# Patient Record
Sex: Female | Born: 1955 | Race: White | Hispanic: No | Marital: Married | Smoking: Current every day smoker
Health system: Southern US, Community
[De-identification: ages and names within clinical notes are randomized; demographics above are authoritative.]

## PROBLEM LIST (undated history)

## (undated) DIAGNOSIS — E7800 Pure hypercholesterolemia, unspecified: Secondary | ICD-10-CM

## (undated) HISTORY — PX: APPENDECTOMY: SHX54

## (undated) HISTORY — PX: NECK SURGERY: SHX720

## (undated) HISTORY — PX: TONSILLECTOMY: SUR1361

---

## 2010-02-12 ENCOUNTER — Ambulatory Visit: Payer: Self-pay | Admitting: Cardiology

## 2014-07-01 ENCOUNTER — Emergency Department (HOSPITAL_COMMUNITY)
Admission: EM | Admit: 2014-07-01 | Discharge: 2014-07-01 | Disposition: A | Discharge status: Home / Self Care | Payer: BC Managed Care – PPO | Attending: Emergency Medicine | Admitting: Emergency Medicine

## 2014-07-01 ENCOUNTER — Encounter (HOSPITAL_COMMUNITY): Payer: Self-pay | Admitting: Emergency Medicine

## 2014-07-01 ENCOUNTER — Emergency Department (HOSPITAL_COMMUNITY): Payer: BC Managed Care – PPO

## 2014-07-01 DIAGNOSIS — R296 Repeated falls: Secondary | ICD-10-CM | POA: Insufficient documentation

## 2014-07-01 DIAGNOSIS — Z862 Personal history of diseases of the blood and blood-forming organs and certain disorders involving the immune mechanism: Secondary | ICD-10-CM | POA: Insufficient documentation

## 2014-07-01 DIAGNOSIS — S93402A Sprain of unspecified ligament of left ankle, initial encounter: Secondary | ICD-10-CM

## 2014-07-01 DIAGNOSIS — S0990XA Unspecified injury of head, initial encounter: Secondary | ICD-10-CM | POA: Insufficient documentation

## 2014-07-01 DIAGNOSIS — Y9289 Other specified places as the place of occurrence of the external cause: Secondary | ICD-10-CM | POA: Insufficient documentation

## 2014-07-01 DIAGNOSIS — Z880 Allergy status to penicillin: Secondary | ICD-10-CM | POA: Diagnosis not present

## 2014-07-01 DIAGNOSIS — W19XXXA Unspecified fall, initial encounter: Secondary | ICD-10-CM

## 2014-07-01 DIAGNOSIS — S93409A Sprain of unspecified ligament of unspecified ankle, initial encounter: Secondary | ICD-10-CM | POA: Diagnosis not present

## 2014-07-01 DIAGNOSIS — Y9389 Activity, other specified: Secondary | ICD-10-CM | POA: Diagnosis not present

## 2014-07-01 DIAGNOSIS — Z8639 Personal history of other endocrine, nutritional and metabolic disease: Secondary | ICD-10-CM | POA: Insufficient documentation

## 2014-07-01 DIAGNOSIS — F17200 Nicotine dependence, unspecified, uncomplicated: Secondary | ICD-10-CM | POA: Diagnosis not present

## 2014-07-01 MED ORDER — OXYCODONE-ACETAMINOPHEN 5-325 MG PO TABS: 1.0000 | ORAL_TABLET | ORAL | Status: AC | PRN

## 2014-07-01 NOTE — ED Notes (Signed)
Pt ambulated independently around nurse's station with cam walker. Denies dizziness. nad noted.

## 2014-07-01 NOTE — ED Notes (Addendum)
Pt reports was washing her car today and fell back. Pt c/o of headache,back pain, left ankle pain. Mild bruise/swelling noted to left ankle. Pt unsure of events leading up to fall. Pt denies LOC. Pt reports " my head feels a little funny." pt denies any vision changes. Pt alert and oriented. Airway patent. Speech clear. Pt denies being on any blood thinner.

## 2014-07-01 NOTE — ED Provider Notes (Signed)
CSN: [id number]     Arrival date & time 07/01/14  1622 History  This chart was scribed for Sharyon Cable, MD by Peyton Bottoms, ED Scribe. This patient was seen in room APA02/APA02 and the patient's care was started at 4:44 PM.   Chief Complaint  Patient presents with  . Fall   Patient is a 58 y.o. female presenting with fall. The history is provided by the patient. No language interpreter was used.  Fall This is a new problem. The current episode started 1 to 2 hours ago. The problem has been gradually improving. Pertinent negatives include no chest pain, no abdominal pain, no headaches and no shortness of breath. Nothing aggravates the symptoms. Nothing relieves the symptoms. She has tried nothing for the symptoms.    HPI Comments: UNKNOWN FLANNIGAN is a 58 y.o. female with a history of hyperlipidemia, and no recent illnesses, who presents to the Emergency Department complaining of a fall that occurred 2 hours ago while she was washing her car. She states that she stepped on the rail of the inside of her car in order to wash the top of the car when she fell. She denies loss of consciousness. She states she had head impact during fall and swelling to her left ankle. She denies any cuts or lacerations to her head.  Patient currently denies associated headache, emesis, vision changes, chest pain, shortness of breath, neck pain, back pain, weakness or dizziness. Patient denies any prior history of heart attacks or stroke. Patient states she is not on blood thinners. Patient states she currently feels lightheaded but states it may be due to hunger. Patient is a smoker. Family reports she appeared "Wild eyed" and confused initially after incident but now back to baseline  PMH - none Past Surgical History  Procedure Laterality Date  . Appendectomy    . Tonsillectomy    . Neck surgery     History reviewed. No pertinent family history. History  Substance Use Topics  . Smoking status: Current  Every Day Smoker -- 1.00 packs/day    Types: Cigarettes  . Smokeless tobacco: Not on file  . Alcohol Use: No   OB History   Grav Para Term Preterm Abortions TAB SAB Ect Mult Living                 Review of Systems  Eyes: Negative for visual disturbance.  Respiratory: Negative for shortness of breath.   Cardiovascular: Negative for chest pain.  Gastrointestinal: Negative for abdominal pain.  Musculoskeletal: Negative for back pain and neck pain.  Neurological: Positive for light-headedness. Negative for headaches.  All other systems reviewed and are negative.   Allergies  Penicillins  Home Medications   Prior to Admission medications   Not on File   Triage Vitals: BP 139/87  Pulse 109  Temp(Src) 97.8 F (36.6 C) (Oral)  Resp 14  Ht 5' 4" (1.626 m)  Wt 185 lb (83.915 kg)  BMI 31.74 kg/m2  SpO2 99% Physical Exam  Nursing note and vitals reviewed.   CONSTITUTIONAL: Well developed/well nourished HEAD: small bruise to occiput, no bleeding, no other signs of trauma. EYES: EOMI/PERRL ENMT: Mucous membranes moist NECK: supple no meningeal signs SPINE:entire spine nontender, nexus criteria met, No bruising/crepitance/stepoffs noted to spine CV: S1/S2 noted, no murmurs/rubs/gallops noted LUNGS: Lungs are clear to auscultation bilaterally, no apparent distress ABDOMEN: soft, nontender, no rebound or guarding GU:no cva tenderness NEURO: Pt is awake/alert, moves all extremitiesx4 EXTREMITIES: pulses normal, full ROM,  tenderness to palpation to left ankle and foot, All other extremities/joints palpated/ranged and nontender SKIN: warm, color normal PSYCH: no abnormalities of mood noted  ED Course  Procedures (including critical care time)  DIAGNOSTIC STUDIES: Oxygen Saturation is 99% on RA, normal by my interpretation.    COORDINATION OF CARE: 4:53 PM- Discussed plans to order diagnostic imaging for left ankle. Pt advised of plan for treatment and pt agrees.   Pt  improved We discussed possible use of CT head.  She did not want CT head as she denies current HA, denies LOC and no vomiting.  I feel it is safe to defer CT head.  We discussed strict return precautions  For her ?syncope, EKG unremarkable, she is otherwise healthy and I Doubt cardiac dysrhythmia as cause of fall.    Imaging Review Dg Ankle Complete Left  07/01/2014   CLINICAL DATA:  pain  EXAM: LEFT ANKLE COMPLETE - 3+ VIEW  COMPARISON:  None.  FINDINGS: Partially corticated ossicles just inferior to the lateral malleolus. No significant overlying soft tissue swelling. Ankle mortise intact. Normal mineralization and alignment. No significant osseous degenerative change.  IMPRESSION: 1. Ossicles inferior to the lateral malleolus suggesting old avulsion injury. No definite acute bone abnormality.   Electronically Signed   By: Daniel  Hassell M.D.   On: 07/01/2014 17:22   Dg Foot Complete Left  07/01/2014   CLINICAL DATA:  pain  EXAM: LEFT FOOT - COMPLETE 3+ VIEW  COMPARISON:  None.  FINDINGS: There is no evidence of fracture or dislocation. There is no evidence of arthropathy or other focal bone abnormality. Soft tissues are unremarkable.  IMPRESSION: Negative.   Electronically Signed   By: Daniel  Hassell M.D.   On: 07/01/2014 17:21     EKG Interpretation  Date/Time:  Sunday July 01 2014 17:04:43 EDT Ventricular Rate:  95 PR Interval:  131 QRS Duration: 84 QT Interval:  322 QTC Calculation: 405 R Axis:   84 Text Interpretation:  Sinus rhythm Low voltage, precordial leads Non-specific ST-t changes No previous ECGs available Confirmed by WICKLINE  MD, DONALD ([id number]) on 07/01/2014 5:12:26 PM        MDM   Final diagnoses:  Fall, initial encounter  Sprain of left ankle, initial encounter  Minor head injury, initial encounter    Nursing notes including past medical history and social history reviewed and considered in documentation xrays reviewed and considered   I personally  performed the services described in this documentation, which was scribed in my presence. The recorded information has been reviewed and is accurate.      Donald W Wickline, MD 07/01/14 1827 

## 2014-07-01 NOTE — Discharge Instructions (Signed)
Ankle Sprain °An ankle sprain is an injury to the strong, fibrous tissues (ligaments) that hold the bones of your ankle joint together.  °CAUSES °An ankle sprain is usually caused by a fall or by twisting your ankle. Ankle sprains most commonly occur when you step on the outer edge of your foot, and your ankle turns inward. People who participate in sports are more prone to these types of injuries.  °SYMPTOMS  °· Pain in your ankle. The pain may be present at rest or only when you are trying to stand or walk. °· Swelling. °· Bruising. Bruising may develop immediately or within 1 to 2 days after your injury. °· Difficulty standing or walking, particularly when turning corners or changing directions. °DIAGNOSIS  °Your caregiver will ask you details about your injury and perform a physical exam of your ankle to determine if you have an ankle sprain. During the physical exam, your caregiver will press on and apply pressure to specific areas of your foot and ankle. Your caregiver will try to move your ankle in certain ways. An X-ray exam may be done to be sure a bone was not broken or a ligament did not separate from one of the bones in your ankle (avulsion fracture).  °TREATMENT  °Certain types of braces can help stabilize your ankle. Your caregiver can make a recommendation for this. Your caregiver may recommend the use of medicine for pain. If your sprain is severe, your caregiver may refer you to a surgeon who helps to restore function to parts of your skeletal system (orthopedist) or a physical therapist. °HOME CARE INSTRUCTIONS  °· Apply ice to your injury for 1-2 days or as directed by your caregiver. Applying ice helps to reduce inflammation and pain. °¨ Put ice in a plastic bag. °¨ Place a towel between your skin and the bag. °¨ Leave the ice on for 15-20 minutes at a time, every 2 hours while you are awake. °· Only take over-the-counter or prescription medicines for pain, discomfort, or fever as directed by  your caregiver. °· Elevate your injured ankle above the level of your heart as much as possible for 2-3 days. °· If your caregiver recommends crutches, use them as instructed. Gradually put weight on the affected ankle. Continue to use crutches or a cane until you can walk without feeling pain in your ankle. °· If you have a plaster splint, wear the splint as directed by your caregiver. Do not rest it on anything harder than a pillow for the first 24 hours. Do not put weight on it. Do not get it wet. You may take it off to take a shower or bath. °· You may have been given an elastic bandage to wear around your ankle to provide support. If the elastic bandage is too tight (you have numbness or tingling in your foot or your foot becomes cold and blue), adjust the bandage to make it comfortable. °· If you have an air splint, you may blow more air into it or let air out to make it more comfortable. You may take your splint off at night and before taking a shower or bath. Wiggle your toes in the splint several times per day to decrease swelling. °SEEK MEDICAL CARE IF:  °· You have rapidly increasing bruising or swelling. °· Your toes feel extremely cold or you lose feeling in your foot. °· Your pain is not relieved with medicine. °SEEK IMMEDIATE MEDICAL CARE IF: °· Your toes are numb or blue. °·   You have severe pain that is increasing. °MAKE SURE YOU:  °· Understand these instructions. °· Will watch your condition. °· Will get help right away if you are not doing well or get worse. °Document Released: 10/19/2005 Document Revised: 07/13/2012 Document Reviewed: 10/31/2011 °[employer]® Patient Information ©2015 [employer]. This information is not intended to replace advice given to you by your health care provider. Make sure you discuss any questions you have with your health care provider. ° ° °You have had a head injury which does not appear to require admission at this time. A concussion is a state of changed mental  ability from trauma. ° °SEEK IMMEDIATE MEDICAL ATTENTION IF: °There is confusion or drowsiness (although children frequently become drowsy after injury).  °You cannot awaken the injured person.  °There is nausea (feeling sick to your stomach) or continued, forceful vomiting.  °You notice dizziness or unsteadiness which is getting worse, or inability to walk.  °You have convulsions or unconsciousness.  °You experience severe, persistent headaches not relieved by Tylenol. (Do not take aspirin as this impairs clotting abilities). Take other pain medications only as directed.  °You cannot use arms or legs normally.  °There are changes in pupil sizes. (This is the black center in the colored part of the eye)  °There is clear or bloody discharge from the nose or ears.  °Change in speech, vision, swallowing, or understanding.  °Localized weakness, numbness, tingling, or change in bowel or bladder control. ° °

## 2020-09-06 ENCOUNTER — Emergency Department (HOSPITAL_COMMUNITY)
Admission: EM | Admit: 2020-09-06 | Discharge: 2020-09-06 | Disposition: A | Discharge status: Home / Self Care | Payer: BC Managed Care – PPO | Attending: Emergency Medicine | Admitting: Emergency Medicine

## 2020-09-06 ENCOUNTER — Emergency Department (HOSPITAL_COMMUNITY): Payer: BC Managed Care – PPO

## 2020-09-06 ENCOUNTER — Encounter (HOSPITAL_COMMUNITY): Payer: Self-pay | Admitting: *Deleted

## 2020-09-06 ENCOUNTER — Other Ambulatory Visit: Payer: Self-pay

## 2020-09-06 DIAGNOSIS — R197 Diarrhea, unspecified: Secondary | ICD-10-CM | POA: Diagnosis not present

## 2020-09-06 DIAGNOSIS — R1011 Right upper quadrant pain: Secondary | ICD-10-CM | POA: Diagnosis not present

## 2020-09-06 DIAGNOSIS — F17210 Nicotine dependence, cigarettes, uncomplicated: Secondary | ICD-10-CM | POA: Insufficient documentation

## 2020-09-06 HISTORY — DX: Pure hypercholesterolemia, unspecified: E78.00

## 2020-09-06 LAB — COMPREHENSIVE METABOLIC PANEL
ALT: 44 U/L (ref 0–44)
AST: 34 U/L (ref 15–41)
Albumin: 4.1 g/dL (ref 3.5–5.0)
Alkaline Phosphatase: 65 U/L (ref 38–126)
Anion gap: 10 (ref 5–15)
BUN: 12 mg/dL (ref 8–23)
CO2: 30 mmol/L (ref 22–32)
Calcium: 9.3 mg/dL (ref 8.9–10.3)
Chloride: 101 mmol/L (ref 98–111)
Creatinine, Ser: 0.94 mg/dL (ref 0.44–1.00)
GFR, Estimated: >60 mL/min (ref >60)
Glucose, Bld: 103 mg/dL — ABNORMAL HIGH (ref 70–99)
Potassium: 4.7 mmol/L (ref 3.5–5.1)
Sodium: 141 mmol/L (ref 135–145)
Total Bilirubin: 0.6 mg/dL (ref 0.3–1.2)
Total Protein: 7.4 g/dL (ref 6.5–8.1)

## 2020-09-06 LAB — URINALYSIS, ROUTINE W REFLEX MICROSCOPIC
Bacteria, UA: NONE SEEN
Bilirubin Urine: NEGATIVE
Glucose, UA: NEGATIVE mg/dL
Ketones, ur: NEGATIVE mg/dL
Leukocytes,Ua: NEGATIVE
Nitrite: NEGATIVE
Protein, ur: NEGATIVE mg/dL
Specific Gravity, Urine: 1.008 (ref 1.005–1.030)
pH: 6 (ref 5.0–8.0)

## 2020-09-06 LAB — CBC
HCT: 50.3 % — ABNORMAL HIGH (ref 36.0–46.0)
Hemoglobin: 15.9 g/dL — ABNORMAL HIGH (ref 12.0–15.0)
MCH: 28.5 pg (ref 26.0–34.0)
MCHC: 31.6 g/dL (ref 30.0–36.0)
MCV: 90.1 fL (ref 80.0–100.0)
Platelets: 417 10*3/uL — ABNORMAL HIGH (ref 150–400)
RBC: 5.58 MIL/uL — ABNORMAL HIGH (ref 3.87–5.11)
RDW: 13.2 % (ref 11.5–15.5)
WBC: 13 10*3/uL — ABNORMAL HIGH (ref 4.0–10.5)
nRBC: 0 % (ref 0.0–0.2)

## 2020-09-06 LAB — LIPASE, BLOOD: Lipase: 49 U/L (ref 11–51)

## 2020-09-06 MED ORDER — IOHEXOL 300 MG/ML  SOLN
100.0000 mL | Freq: Once | INTRAMUSCULAR | Status: AC | PRN
  Administered 2020-09-06: 100 mL via INTRAVENOUS

## 2020-09-06 NOTE — ED Notes (Signed)
Difficult to communicate w pt as her family member speaks for her and is not easily dissuaded

## 2020-09-06 NOTE — ED Provider Notes (Signed)
[facility] EMERGENCY DEPARTMENT Provider Note   CSN: [id number] Arrival date & time: 09/06/20  1217     History Chief Complaint  Patient presents with  . Diarrhea    MAAHI Watts is a 64 y.o. female here with c/o diarrhea. It began as watery stool and is now loose. She is having 4-5 episodes a day.  She is been having some intermittent right upper quadrant abdominal pain.  She saw her primary care doctor who took a chest x-ray and got some lab work and due to an elevated white blood cell count started her on Cipro and Flagyl for suspected diverticulitis.  She states it has not gotten any better.  She has no active pain at this time.  She denies fevers, chills, vomiting.  HPI     Past Medical History:  Diagnosis Date  . Hypercholesterolemia     There are no problems to display for this patient.   Past Surgical History:  Procedure Laterality Date  . APPENDECTOMY    . NECK SURGERY    . TONSILLECTOMY       OB History   No obstetric history on file.     No family history on file.  Social History   Tobacco Use  . Smoking status: Current Every Day Smoker    Packs/day: 1.00    Types: Cigarettes  . Smokeless tobacco: Never Used  Substance Use Topics  . Alcohol use: No  . Drug use: No    Home Medications Prior to Admission medications   Medication Sig Start Date End Date Taking? Authorizing Provider  ALPRAZolam Prudy Feeler) 0.5 MG tablet Take 0.5 mg by mouth 2 (two) times daily. 05/21/14   [provider]  esomeprazole (NEXIUM) 20 MG capsule Take 20 mg by mouth daily at 12 noon.    [provider]  fexofenadine (ALLEGRA) 180 MG tablet Take 180 mg by mouth daily.    [provider]  ibuprofen (ADVIL,MOTRIN) 200 MG tablet Take 400 mg by mouth every 6 (six) hours as needed for mild pain.    [provider]  oxyCODONE-acetaminophen (PERCOCET/ROXICET) 5-325 MG per tablet Take 1 tablet by mouth every 4 (four) hours as needed for severe  pain. 07/01/14   Zadie Rhine, MD  Phenyleph-Doxylamine-DM-APAP (ALKA SELTZER PLUS PO) Take 2 tablets by mouth daily as needed (Cold Symptoms).    [provider]  triamcinolone cream (KENALOG) 0.1 % Apply 1 application topically daily.    [provider]  venlafaxine XR (EFFEXOR-XR) 150 MG 24 hr capsule Take 150 mg by mouth daily. 06/07/14   [provider]  ZETIA 10 MG tablet Take 10 mg by mouth daily. 06/07/14   [provider]    Allergies    Medrol [methylprednisolone] and Penicillins  Review of Systems   Review of Systems Ten systems reviewed and are negative for acute change, except as noted in the HPI.   Physical Exam Updated Vital Signs BP (!) 150/56 (BP Location: Right Arm)   Pulse 92   Temp 97.8 F (36.6 C) (Oral)   Resp 18   SpO2 94%   Physical Exam Vitals and nursing note reviewed.  Constitutional:      General: She is not in acute distress.    Appearance: She is well-developed. She is not diaphoretic.  HENT:     Head: Normocephalic and atraumatic.  Eyes:     General: No scleral icterus.    Conjunctiva/sclera: Conjunctivae normal.  Cardiovascular:     Rate  and Rhythm: Normal rate and regular rhythm.     Heart sounds: Normal heart sounds. No murmur heard.  No friction rub. No gallop.   Pulmonary:     Effort: Pulmonary effort is normal. No respiratory distress.     Breath sounds: Normal breath sounds.  Abdominal:     General: Bowel sounds are normal. There is no distension.     Palpations: Abdomen is soft. There is no mass.     Tenderness: There is no abdominal tenderness. There is no guarding.  Musculoskeletal:     Cervical back: Normal range of motion.  Skin:    General: Skin is warm and dry.  Neurological:     Mental Status: She is alert and oriented to person, place, and time.  Psychiatric:        Behavior: Behavior normal.       ED Results / Procedures / Treatments   Labs (all labs ordered are listed, but  only abnormal results are displayed) Labs Reviewed  COMPREHENSIVE METABOLIC PANEL - Abnormal; Notable for the following components:      Result Value   Glucose, Bld 103 (*)    All other components within normal limits  CBC - Abnormal; Notable for the following components:   WBC 13.0 (*)    RBC 5.58 (*)    Hemoglobin 15.9 (*)    HCT 50.3 (*)    Platelets 417 (*)    All other components within normal limits  URINALYSIS, ROUTINE W REFLEX MICROSCOPIC - Abnormal; Notable for the following components:   Hgb urine dipstick SMALL (*)    All other components within normal limits  GASTROINTESTINAL PANEL BY PCR, STOOL (REPLACES STOOL CULTURE)  C DIFFICILE QUICK SCREEN W PCR REFLEX  LIPASE, BLOOD    EKG None  Radiology No results found.  Procedures Procedures (including critical care time)  Medications Ordered in ED Medications - No data to display  ED Course  I have reviewed the triage vital signs and the nursing notes.  Pertinent labs & imaging results that were available during my care of the patient were reviewed by me and considered in my medical decision making (see chart for details).    MDM Rules/Calculators/A&P                          [id number] VS: BP 130/77 (BP Location: Right Arm)   Pulse 90   Temp 97.8 F (36.6 C) (Oral)   Resp 20   SpO2 95%   WV:PXTGGYI is gathered by patient and emr. Previous records obtained and reviewed. DDX:The patient's complaint of diarrhea involves an extensive number of diagnostic and treatment options, and is a complaint that carries with it a high risk of complications, morbidity, and potential mortality. Given the large differential diagnosis, medical decision making is of high complexity. The differential diagnosis of diarrhea includes but is not limited to Viral- norovirus/rotavirus; Bacterial-Campylobacter,Shigella, Salmonella, Escherichia coli, E. coli 0157:H7, Yersinia enterocolitica, Vibrio cholerae, Clostridium  difficile. Parasitic- Giardia lamblia, Cryptosporidium,Entamoeba histolytica,Cyclospora, Microsporidium. Toxin- Staphylococcus aureus, Bacillus cereus. Noninfectious causes include GI Bleed, Appendicitis, Mesenteric Ischemia, Diverticulitis, Adrenal Crisis, Thyroid Storm, Toxicologic exposures, Antibiotic or drug-associated, inflammatory bowel disease. Labs: I ordered reviewed and interpreted labs which include CBC which shows mildly elevated white blood cell count, CMP with slightly elevated blood glucose of insignificant value, urine without evidence of infection. C. difficile and gastrointestinal panel by PCR ordered however patient was unable to provide a stool sample. Imaging: I ordered  and reviewed images which included CT abdomen and pelvis with contrast . I independently visualized and interpreted all imaging. There are no acute, significant findings on today's images. EKG: Consults MDM: Patient here with complaint of diarrhea x3 weeks.  She has tried Imodium without relief.  She has had some intermittent right upper quadrant pain but is asymptomatic at this time and has no abdominal tenderness on my examination.  Given the findings of her CT scan and lab work-up I do not feel that there is any emergent cause of her symptoms.  She has been treated for the last 8 days with Cipro and Flagyl also if she had any GI pathogen is likely been taking care of at this point.  I told her that she can finish this up.  She may add in Metamucil which may help bind her loose stool with increased bulk.  She may try Imodium again and I have given her an ambulatory referral to GI specialty. Patient disposition:The patient appears reasonably screened and/or stabilized for discharge and I doubt any other medical condition or other [facility] requiring further screening, evaluation, or treatment in the ED at this time prior to discharge. I have discussed lab and/or imaging findings with the patient and answered all  questions/concerns to the best of my ability.I have discussed return precautions and OP follow up.    Final Clinical Impression(s) / ED Diagnoses Final diagnoses:  None    Rx / DC Orders ED Discharge Orders    None       Arthor Captain, PA-C 09/06/20 2059    Bethann Berkshire, MD 09/06/20 2237

## 2020-09-06 NOTE — ED Triage Notes (Signed)
Diarrhea for 3 weeks.

## 2020-09-06 NOTE — Discharge Instructions (Addendum)
Get help right away if: You have chest pain. You feel extremely weak or you faint. You have bloody or black stools or stools that look like tar. You have severe pain, cramping, or bloating in your abdomen. You have trouble breathing or you are breathing very quickly. Your heart is beating very quickly. Your skin feels cold and clammy. You feel confused. You have signs of dehydration, such as: Dark urine, very little urine, or no urine. Cracked lips. Dry mouth. Sunken eyes. Sleepiness. Weakness. 

## 2020-09-06 NOTE — ED Notes (Signed)
Pt has been on Cipro and Flagyl for several weeks for her diverticular dx   No one told her that the medication could give her D nor suggested probiotics  BS audible,   NAD   abd soft

## 2020-09-06 NOTE — ED Notes (Signed)
To CT

## 2020-09-09 ENCOUNTER — Telehealth: Payer: Self-pay

## 2020-09-09 NOTE — Telephone Encounter (Signed)
Pt was referred to [place] by the ER. Please advise if we can accept her as a new patient.

## 2020-09-09 NOTE — Telephone Encounter (Signed)
Pt is aware of OV with Dr Marletta Lor this Wednesday at noon

## 2020-09-09 NOTE — Telephone Encounter (Signed)
Ok to schedule ov.  

## 2020-09-11 ENCOUNTER — Other Ambulatory Visit: Payer: Self-pay

## 2020-09-11 ENCOUNTER — Ambulatory Visit: Payer: BC Managed Care – PPO | Admitting: Internal Medicine

## 2020-09-11 ENCOUNTER — Encounter: Payer: Self-pay | Admitting: Internal Medicine

## 2020-09-11 ENCOUNTER — Encounter: Payer: Self-pay | Admitting: *Deleted

## 2020-09-11 DIAGNOSIS — R197 Diarrhea, unspecified: Secondary | ICD-10-CM

## 2020-09-11 DIAGNOSIS — R1011 Right upper quadrant pain: Secondary | ICD-10-CM | POA: Insufficient documentation

## 2020-09-11 NOTE — Progress Notes (Addendum)
Primary Care Physician:  Zachery Dauer, MD Primary Gastroenterologist:  Dr. Marletta Lor  Chief Complaint  Patient presents with  . Diarrhea    x 1 month  . Weight Loss    down 12 lbs in 1 month     HPI:   Sharon Watts is a 64 y.o. female who presents to the clinic today by referral from her PCP Dr. Dorna Leitz for evaluation.  Patient states approximately 2 to 3 weeks ago she had sudden onset of abdominal pain and diarrhea.  Notes her pain is right upper quadrant and radiates to her shoulder.  No GERD or heartburn.  No dysphagia or odynophagia.  No melena or hematochezia.  Does chronically take diclofenac orally but states she stopped this last week.  Also stopped her Zetia a few days ago and has not noted any improvement.  Diarrhea occurs frequently on a daily basis multiple times daily.  States she was diagnosed with microscopic colitis in 2014 which responded well to budesonide therapy no melena or hematochezia as above.  Last colonoscopy in 2019 in [place] which she states was unremarkable.  No recent sick contacts that she knows of.  No recent picnics or outdoor eating events.  No change in diet.   Recently presented to the ERDue to her symptoms, blood work unremarkable sides mildly elevated WBC of 13,000.  CT abdomen pelvis which I personally reviewed, showed small hiatal hernia, sigmoid diverticulosis without diverticulitis, otherwise largely unremarkable.  She was discharged home on Cipro and Flagyl without improvement in her symptoms.  Past Medical History:  Diagnosis Date  . Hypercholesterolemia     Past Surgical History:  Procedure Laterality Date  . APPENDECTOMY    . NECK SURGERY    . TONSILLECTOMY      Current Outpatient Medications  Medication Sig Dispense Refill  . ALPRAZolam (XANAX) 0.5 MG tablet Take 0.5 mg by mouth 2 (two) times daily.    . Cholecalciferol (VITAMIN D3 PO) Take 2,000 Units by mouth daily. 2000 mg    . esomeprazole (NEXIUM) 20 MG capsule Take 20 mg  by mouth daily at 12 noon.    . fexofenadine (ALLEGRA) 180 MG tablet Take 180 mg by mouth daily.    . hydrOXYzine (ATARAX/VISTARIL) 50 MG tablet Take 50 mg by mouth daily.    Marland Kitchen ibuprofen (ADVIL,MOTRIN) 200 MG tablet Take 400 mg by mouth every 6 (six) hours as needed for mild pain.    Marland Kitchen triamcinolone cream (KENALOG) 0.1 % Apply 1 application topically daily.    Marland Kitchen venlafaxine XR (EFFEXOR-XR) 150 MG 24 hr capsule Take 150 mg by mouth daily.    Marland Kitchen ZETIA 10 MG tablet Take 10 mg by mouth daily.    Marland Kitchen oxyCODONE-acetaminophen (PERCOCET/ROXICET) 5-325 MG per tablet Take 1 tablet by mouth every 4 (four) hours as needed for severe pain. (Patient not taking: Reported on 09/11/2020) 15 tablet 0  . Phenyleph-Doxylamine-DM-APAP (ALKA SELTZER PLUS PO) Take 2 tablets by mouth daily as needed (Cold Symptoms). (Patient not taking: Reported on 09/11/2020)     No current facility-administered medications for this visit.    Allergies as of 09/11/2020 - Review Complete 09/11/2020  Allergen Reaction Noted  . Medrol [methylprednisolone] Hives 07/01/2014  . Penicillins Hives 07/01/2014    History reviewed. No pertinent family history.  Social History   Socioeconomic History  . Marital status: Married    Spouse name: Not on file  . Number of children: Not on file  . Years of education:  Not on file  . Highest education level: Not on file  Occupational History  . Not on file  Tobacco Use  . Smoking status: Current Every Day Smoker    Packs/day: 1.00    Types: Cigarettes  . Smokeless tobacco: Never Used  Substance and Sexual Activity  . Alcohol use: No  . Drug use: No  . Sexual activity: Not on file  Other Topics Concern  . Not on file  Social History Narrative  . Not on file   Social Determinants of Health   Financial Resource Strain:   . Difficulty of Paying Living Expenses: Not on file  Food Insecurity:   . Worried About Programme researcher, broadcasting/film/video in the Last Year: Not on file  . Ran Out of Food in  the Last Year: Not on file  Transportation Needs:   . Lack of Transportation (Medical): Not on file  . Lack of Transportation (Non-Medical): Not on file  Physical Activity:   . Days of Exercise per Week: Not on file  . Minutes of Exercise per Session: Not on file  Stress:   . Feeling of Stress : Not on file  Social Connections:   . Frequency of Communication with Friends and Family: Not on file  . Frequency of Social Gatherings with Friends and Family: Not on file  . Attends Religious Services: Not on file  . Active Member of Clubs or Organizations: Not on file  . Attends Banker Meetings: Not on file  . Marital Status: Not on file  Intimate Partner Violence:   . Fear of Current or Ex-Partner: Not on file  . Emotionally Abused: Not on file  . Physically Abused: Not on file  . Sexually Abused: Not on file    Subjective: Review of Systems  Constitutional: Negative for chills and fever.  HENT: Negative for congestion and hearing loss.   Eyes: Negative for blurred vision and double vision.  Respiratory: Negative for cough and shortness of breath.   Cardiovascular: Negative for chest pain and palpitations.  Gastrointestinal: Positive for abdominal pain and diarrhea. Negative for blood in stool, constipation, heartburn, melena and vomiting.  Genitourinary: Negative for dysuria and urgency.  Musculoskeletal: Negative for joint pain and myalgias.  Skin: Negative for itching and rash.  Neurological: Negative for dizziness and headaches.  Psychiatric/Behavioral: Negative for depression. The patient is not nervous/anxious.        Objective: BP (!) 154/92   Pulse (!) 105   Temp (!) 96.9 F (36.1 C) (Temporal)   Ht 5\' 4"  (1.626 m)   Wt 203 lb 6.4 oz (92.3 kg)   BMI 34.91 kg/m  Physical Exam Constitutional:      Appearance: Normal appearance.  HENT:     Head: Normocephalic and atraumatic.  Eyes:     Extraocular Movements: Extraocular movements intact.      Conjunctiva/sclera: Conjunctivae normal.  Cardiovascular:     Rate and Rhythm: Normal rate and regular rhythm.  Pulmonary:     Effort: Pulmonary effort is normal.     Breath sounds: Normal breath sounds.  Abdominal:     General: Bowel sounds are normal.     Palpations: Abdomen is soft.  Musculoskeletal:        General: No swelling. Normal range of motion.     Cervical back: Normal range of motion and neck supple.  Skin:    General: Skin is warm and dry.     Coloration: Skin is not jaundiced.  Neurological:  General: No focal deficit present.     Mental Status: She is alert and oriented to person, place, and time.  Psychiatric:        Mood and Affect: Mood normal.        Behavior: Behavior normal.      Assessment: *Diarrhea-new onset *Right upper quadrant pain *History of microscopic colitis  Plan: Etiology of patient's diarrhea unclear.  Given her reported history of microscopic colitis, this is certainly high on differential.   -We will check stool studies to rule out infectious diarrhea. -Check TTG IgA as well as TSH -For her right upper quadrant pain I will check ultrasound to rule out biliary colic -If her labs and stool studies are unremarkable, I will likely start her on budesonide therapy for recurrent microscopic colitis. -May need EGD in the future to rule out H. pylori as well as celiac sprue -May need hydrogen breath testing to rule out SIBO -Further recommendations to follow  Patient to follow-up in 3 months or sooner if needed  09/11/2020 12:18 PM   Disclaimer: This note was dictated with voice recognition software. Similar sounding words can inadvertently be transcribed and may not be corrected upon review.

## 2020-09-11 NOTE — Patient Instructions (Signed)
We need to check stool studies to make sure you do not have an infection as cause of your diarrhea.  I will also request your previous colonoscopy report from [facility].  We will order right upper quadrant ultrasound to evaluate gallbladder dysfunction as a cause of your symptoms.  Assuming your stool studies are negative, I will likely start you on budesonide given history of microscopic colitis and normal colonoscopy in 2019.  Follow-up with GI in 3 months or sooner if needed.  At [facility] we value your feedback. You may receive a survey about your visit today. Please share your experience as we strive to create trusting relationships with our patients to provide genuine, compassionate, quality care.  We appreciate your understanding and patience as we review any laboratory studies, imaging, and other diagnostic tests that are ordered as we care for you. Our office policy is 5 business days for review of these results, and any emergent or urgent results are addressed in a timely manner for your best interest. If you do not hear from our office in 1 week, please contact us.   We also encourage the use of MyChart, which contains your medical information for your review as well. If you are not enrolled in this feature, an access code is on this after visit summary for your convenience. Thank you for allowing [place] to be involved in your care.  It was great to see you today!  I hope you have a great rest of your fall!!    Sharon Watts, D.O. Gastroenterology and Hepatology [facility]

## 2020-09-14 LAB — CELIAC AB TTG DGP TIGA
Antigliadin Abs, IgA: 5 units (ref 0–19)
Gliadin IgG: 2 units (ref 0–19)
IgA/Immunoglobulin A, Serum: 228 mg/dL (ref 87–352)
Tissue Transglut Ab: 7 U/mL — ABNORMAL HIGH (ref 0–5)
Transglutaminase IgA: <2 U/mL (ref 0–3)

## 2020-09-14 LAB — T4 AND TSH
T4, Total: 8.3 ug/dL (ref 4.5–12.0)
TSH: 1.51 u[IU]/mL (ref 0.450–4.500)

## 2020-09-17 ENCOUNTER — Ambulatory Visit: Payer: BC Managed Care – PPO | Admitting: Gastroenterology

## 2020-09-17 ENCOUNTER — Telehealth: Payer: Self-pay | Admitting: Internal Medicine

## 2020-09-17 ENCOUNTER — Telehealth: Payer: Self-pay

## 2020-09-17 DIAGNOSIS — A0472 Enterocolitis due to Clostridium difficile, not specified as recurrent: Secondary | ICD-10-CM

## 2020-09-17 LAB — GI PROFILE, STOOL, PCR

## 2020-09-17 MED ORDER — VANCOMYCIN HCL 125 MG PO CAPS: 125.0000 mg | ORAL_CAPSULE | Freq: Four times a day (QID) | ORAL | 0 refills | Status: AC

## 2020-09-17 NOTE — Telephone Encounter (Signed)
Patient called and said the medicine that was called in for her c-diff needs a prior authorization.

## 2020-09-17 NOTE — Telephone Encounter (Signed)
Routing message 

## 2020-09-17 NOTE — Telephone Encounter (Signed)
NOTED

## 2020-09-17 NOTE — Telephone Encounter (Signed)
Patient has C. difficile infection.  I called and discussed these results with her.  I will send in prescription for vancomycin oral 125 mg 4 times daily for 10 days.    I counseled patient on the importance of cleaning practices at home including using bleach in the bathroom and surfaces as well as washing her hands frequently with warm soapy water.  She has an ultrasound scheduled for Thursday of this week, can we call and cancel this for her?  She will follow up with me as previously scheduled.  Thank you

## 2020-09-17 NOTE — Telephone Encounter (Signed)
PT APPROVED TO RECEIVE Rx VANCOMYCIN HCL 125 MG CAPSULE FOR 09/17/2020 TO 12/16/2020. WILL FORWARD TO [facility] TO SCAN INTO CHART.

## 2020-09-19 ENCOUNTER — Ambulatory Visit (HOSPITAL_COMMUNITY): Payer: BC Managed Care – PPO

## 2020-09-19 NOTE — Telephone Encounter (Signed)
PTS. MED IS APPROVED. FAXED TO [facility] IN [place] SO PT CAN RECEIVE HER MEDS. PHONED AND LM ON PTS VM THAT HER MED WAS APPROVED AND LETTER FAXED TO HER PHARMACY.

## 2020-09-24 ENCOUNTER — Telehealth: Payer: Self-pay | Admitting: Internal Medicine

## 2020-09-24 NOTE — Telephone Encounter (Signed)
Phoned and spoke with the pt. She states she has had diarrhea since Oct. 11th but she is not having any abdominal pain. She is very fatigued from this please advise.

## 2020-09-24 NOTE — Telephone Encounter (Signed)
[phone] Patient diagnosed with c-diff and she has antibiotics until Friday and her diarrhea has not stopped.  She is concerned that it is not going away  Please call

## 2020-09-25 ENCOUNTER — Telehealth: Payer: Self-pay

## 2020-09-25 NOTE — Telephone Encounter (Signed)
Pt called regarding her diarrhea this morning. States she never heard back from [place] regarding her phone call yesterday, I advised her a phone note was sent and I was still waiting on a response. I asked her was her diarrhea better she stated that she has only been once today compared to 4 yesterday. I told her it sounds like its getting better and she stated it is but she is worried that she may need another round of antibiotics. Pt also stated she thought to try Imodium AD but according to what she read she has no business taking it. Dr. Marletta Lor please advise

## 2020-09-29 ENCOUNTER — Telehealth: Payer: Self-pay | Admitting: Internal Medicine

## 2020-09-29 DIAGNOSIS — A0472 Enterocolitis due to Clostridium difficile, not specified as recurrent: Secondary | ICD-10-CM

## 2020-09-29 MED ORDER — VANCOMYCIN HCL 125 MG PO CAPS: 125.0000 mg | ORAL_CAPSULE | Freq: Four times a day (QID) | ORAL | 0 refills | Status: AC

## 2020-09-29 NOTE — Telephone Encounter (Signed)
I called and spoke with patient. She states her BMs are improved to semi solid. I am going to send in her another 10 days worth of PO Vancomycin. I explained to her I was out of town last week for the holiday and she understands.

## 2020-09-30 NOTE — Telephone Encounter (Signed)
noted 

## 2020-10-03 ENCOUNTER — Telehealth: Payer: Self-pay | Admitting: Internal Medicine

## 2020-10-03 NOTE — Telephone Encounter (Signed)
I called patient and spoke with her

## 2020-10-03 NOTE — Telephone Encounter (Signed)
Patient wants to speak with Dr Marletta Lor. Please call patient. [phone]

## 2020-10-03 NOTE — Telephone Encounter (Signed)
Dr. Marletta Lor I phoned the pt back and she advises she spoke with you Sunday and you sent in Rx for Vancomycin and she states she's still having diarrhea. States she wants to speak with you because she is not sure if you want her to continue the other medications for C-Diff.

## 2020-10-04 ENCOUNTER — Telehealth: Payer: Self-pay

## 2020-10-04 NOTE — Telephone Encounter (Signed)
noted 

## 2020-10-04 NOTE — Telephone Encounter (Signed)
PT HAS BEEN APPROVED FOR VANCOMYCIN HCL 125 MG CAPSULES FROM 09/17/2020 TO 12/16/2020.

## 2020-10-09 ENCOUNTER — Telehealth: Payer: Self-pay | Admitting: Internal Medicine

## 2020-10-09 NOTE — Telephone Encounter (Signed)
Please call patient [phone], she has c-diff and is on her last antibiotic and she is still having diarrhea

## 2020-10-09 NOTE — Telephone Encounter (Signed)
Routing to Dr. Marletta Lor I had discussed with him. He is going to call the pt.

## 2020-10-10 ENCOUNTER — Telehealth: Payer: Self-pay

## 2020-10-10 NOTE — Telephone Encounter (Signed)
noted 

## 2020-10-10 NOTE — Telephone Encounter (Signed)
Sent Dr. Marletta Lor a text with this pt's appt with him on 12/06/19 and her ultrasound is scheduled for 10/15/20 @ 11:30 am

## 2020-10-10 NOTE — Telephone Encounter (Signed)
1.Dr. Marletta Lor just called and stated to please reschedule the pt's U/S.   2. Dr. Marletta Lor states please schedule this pt to return to the office in 2 to 3 weeks with him or any of the other providers.

## 2020-10-10 NOTE — Telephone Encounter (Signed)
[place] scheduled for 10/15/20 at 11:30am, arrive at 11:15am. NPO 6-8 hours before test.   Called and informed pt of [place] appt. Letter mailed.

## 2020-10-10 NOTE — Telephone Encounter (Signed)
Patient scheduled to see Dr. Marletta Lor 02/03  that is the next available

## 2020-10-15 ENCOUNTER — Other Ambulatory Visit: Payer: Self-pay

## 2020-10-15 ENCOUNTER — Ambulatory Visit (HOSPITAL_COMMUNITY)
Admission: RE | Admit: 2020-10-15 | Discharge: 2020-10-15 | Disposition: A | Discharge status: Home / Self Care | Payer: BC Managed Care – PPO | Source: Ambulatory Visit | Attending: Internal Medicine | Admitting: Internal Medicine

## 2020-10-15 DIAGNOSIS — R197 Diarrhea, unspecified: Secondary | ICD-10-CM | POA: Insufficient documentation

## 2020-10-15 DIAGNOSIS — R1011 Right upper quadrant pain: Secondary | ICD-10-CM

## 2020-10-16 ENCOUNTER — Telehealth: Payer: Self-pay | Admitting: Internal Medicine

## 2020-10-16 NOTE — Telephone Encounter (Signed)
Pt said she just spoke to the nurse and wanted her to call her back. [phone]

## 2020-10-16 NOTE — Telephone Encounter (Signed)
Spoke with pt. Pt reports that she is still having diarrhea s/p Vancomycin for Cdiff. Pt is having 4-5 bowel movements a day. Pt mentioned that she is taking an Imodium tablet when she has to leave her home. Pt reports no pain, no nausea or vomiting. Stool is loose to watery.

## 2020-12-02 NOTE — Telephone Encounter (Signed)
I received a call from the patient stating she is having diarrhea.   She is having about 4-5 times bowel movements a day.  She has an appointment scheduled on 2/9 with Marletta Lor, however she was wondering if he could give her something for her diarrhea.   She has no fever, abd pain, nausea or vomiting, but she is experiencing some lower back pain.  Routing to Dr. Marletta Lor  The patient can be reached at [phone]

## 2020-12-03 ENCOUNTER — Telehealth: Payer: Self-pay | Admitting: Internal Medicine

## 2020-12-03 DIAGNOSIS — R197 Diarrhea, unspecified: Secondary | ICD-10-CM

## 2020-12-03 NOTE — Telephone Encounter (Signed)
Ordered placed for [facility]. Thank you

## 2020-12-03 NOTE — Telephone Encounter (Signed)
Lab order faxed to [facility] in [place], [place] at [phone]

## 2020-12-03 NOTE — Telephone Encounter (Signed)
Pt called to see if Dr. Marletta Lor had some information about her diarrhea. Routing message to Dr. Marletta Lor. Pt is aware that we will contact her back.

## 2020-12-03 NOTE — Telephone Encounter (Signed)
It has been 4 months since initial c diff infection. I am going to recheck her now for recurrence with stool testing. Please let me know which lab she would prefer and I will place order. Thank you

## 2020-12-03 NOTE — Telephone Encounter (Signed)
Spoke with pt. Pt is aware that Dr. Marletta Lor would like to check for C diff and lab orders were placed for [facility]. Pt will complete stool testing.

## 2020-12-03 NOTE — Telephone Encounter (Signed)
Patient would like to go to [facility] in [place], [place]

## 2020-12-09 LAB — C DIFFICILE, CYTOTOXIN B

## 2020-12-09 LAB — C DIFFICILE TOXINS A+B W/RFLX: C difficile Toxins A+B, EIA: NEGATIVE

## 2020-12-09 NOTE — Telephone Encounter (Signed)
Patient scheduled for appointment this Wednesday

## 2020-12-11 ENCOUNTER — Encounter: Payer: Self-pay | Admitting: *Deleted

## 2020-12-11 ENCOUNTER — Encounter: Payer: Self-pay | Admitting: Internal Medicine

## 2020-12-11 ENCOUNTER — Other Ambulatory Visit: Payer: Self-pay

## 2020-12-11 ENCOUNTER — Ambulatory Visit: Payer: BC Managed Care – PPO | Admitting: Internal Medicine

## 2020-12-11 VITALS — BP 139/80 | HR 104 | Temp 96.2°F | Ht 63.0 in | Wt 194.2 lb

## 2020-12-11 DIAGNOSIS — A0472 Enterocolitis due to Clostridium difficile, not specified as recurrent: Secondary | ICD-10-CM

## 2020-12-11 DIAGNOSIS — R197 Diarrhea, unspecified: Secondary | ICD-10-CM

## 2020-12-11 DIAGNOSIS — R1011 Right upper quadrant pain: Secondary | ICD-10-CM

## 2020-12-11 MED ORDER — BUDESONIDE 3 MG PO CPEP: 9.0000 mg | ORAL_CAPSULE | Freq: Every day | ORAL | 2 refills | Status: AC

## 2020-12-11 MED ORDER — DIPHENOXYLATE-ATROPINE 2.5-0.025 MG PO TABS: 1.0000 | ORAL_TABLET | Freq: Four times a day (QID) | ORAL | 2 refills | Status: AC | PRN

## 2020-12-11 NOTE — Progress Notes (Signed)
Referring Provider: Zachery Dauer, MD Primary Care Physician:  Zachery Dauer, MD Primary GI:  Dr. Marletta Lor  Chief Complaint  Patient presents with  . Follow-up    Diarrhea daily    HPI:   Sharon Watts is a 65 y.o. female who presents to clinic today for follow-up.  Previously seen approximately 4 months ago for diarrhea.  Subsequent stool testing revealed that she was positive for C. difficile.  She underwent a 10-day course of vancomycin which initially helped but her symptoms did not fully disappear.  She was then placed on extended 6-week taper which she states cleared up her diarrhea for nearly 2 months and now it is back.  She states she is having 10 loose stools daily.  I repeated C. difficile testing and is negative.  She notes history of microscopic colitis in 2012 and was treated with budesonide at that time.  She feels as though that is likely is what causing it again today.  Also notes chronic right upper quadrant pain.  She had an ultrasound which was negative for gallbladder pathology but did note fatty liver disease.  He states she has been taking a probiotic for nearly 2 months now.  Past Medical History:  Diagnosis Date  . Hypercholesterolemia     Past Surgical History:  Procedure Laterality Date  . APPENDECTOMY    . NECK SURGERY    . TONSILLECTOMY      Current Outpatient Medications  Medication Sig Dispense Refill  . ALPRAZolam (XANAX) 0.5 MG tablet Take 0.5 mg by mouth daily.    . budesonide (ENTOCORT EC) 3 MG 24 hr capsule Take 3 capsules (9 mg total) by mouth daily. 90 capsule 2  . Cholecalciferol (VITAMIN D3 PO) Take 2,000 Units by mouth daily. 2000 mg    . diphenoxylate-atropine (LOMOTIL) 2.5-0.025 MG tablet Take 1 tablet by mouth 4 (four) times daily as needed for diarrhea or loose stools. 30 tablet 2  . fexofenadine (ALLEGRA) 180 MG tablet Take 180 mg by mouth daily.    . hydrOXYzine (ATARAX/VISTARIL) 50 MG tablet Take 50 mg by mouth daily.    Marland Kitchen  ibuprofen (ADVIL,MOTRIN) 200 MG tablet Take 400 mg by mouth as needed for mild pain.    Marland Kitchen venlafaxine XR (EFFEXOR-XR) 150 MG 24 hr capsule Take 75 mg by mouth daily. Takes 75 mg daily.    Marland Kitchen ZETIA 10 MG tablet Take 10 mg by mouth daily.    Marland Kitchen esomeprazole (NEXIUM) 20 MG capsule Take 20 mg by mouth daily at 12 noon. (Patient not taking: Reported on 12/11/2020)    . oxyCODONE-acetaminophen (PERCOCET/ROXICET) 5-325 MG per tablet Take 1 tablet by mouth every 4 (four) hours as needed for severe pain. (Patient not taking: No sig reported) 15 tablet 0  . Phenyleph-Doxylamine-DM-APAP (ALKA SELTZER PLUS PO) Take 2 tablets by mouth daily as needed (Cold Symptoms). (Patient not taking: No sig reported)    . triamcinolone cream (KENALOG) 0.1 % Apply 1 application topically daily. (Patient not taking: Reported on 12/11/2020)     No current facility-administered medications for this visit.    Allergies as of 12/11/2020 - Review Complete 12/11/2020  Allergen Reaction Noted  . Medrol [methylprednisolone] Hives 07/01/2014  . Penicillins Hives 07/01/2014    No family history on file.  Social History   Socioeconomic History  . Marital status: Married    Spouse name: Not on file  . Number of children: Not on file  . Years of education: Not  on file  . Highest education level: Not on file  Occupational History  . Not on file  Tobacco Use  . Smoking status: Current Every Day Smoker    Packs/day: 1.00    Types: Cigarettes  . Smokeless tobacco: Never Used  Substance and Sexual Activity  . Alcohol use: No  . Drug use: No  . Sexual activity: Not on file  Other Topics Concern  . Not on file  Social History Narrative  . Not on file   Social Determinants of Health   Financial Resource Strain: Not on file  Food Insecurity: Not on file  Transportation Needs: Not on file  Physical Activity: Not on file  Stress: Not on file  Social Connections: Not on file    Subjective: Review of Systems   Constitutional: Negative for chills and fever.  HENT: Negative for congestion and hearing loss.   Eyes: Negative for blurred vision and double vision.  Respiratory: Negative for cough and shortness of breath.   Cardiovascular: Negative for chest pain and palpitations.  Gastrointestinal: Positive for abdominal pain and diarrhea. Negative for blood in stool, constipation, heartburn, melena and vomiting.  Genitourinary: Negative for dysuria and urgency.  Musculoskeletal: Negative for joint pain and myalgias.  Skin: Negative for itching and rash.  Neurological: Negative for dizziness and headaches.  Psychiatric/Behavioral: Negative for depression. The patient is not nervous/anxious.      Objective: BP 139/80   Pulse (!) 104   Temp (!) 96.2 F (35.7 C) (Temporal)   Ht 5\' 3"  (1.6 m)   Wt 194 lb 3.2 oz (88.1 kg)   BMI 34.40 kg/m  Physical Exam Constitutional:      Appearance: Normal appearance.  HENT:     Head: Normocephalic and atraumatic.  Eyes:     Extraocular Movements: Extraocular movements intact.     Conjunctiva/sclera: Conjunctivae normal.  Cardiovascular:     Rate and Rhythm: Normal rate and regular rhythm.  Pulmonary:     Effort: Pulmonary effort is normal.     Breath sounds: Normal breath sounds.  Abdominal:     General: Bowel sounds are normal.     Palpations: Abdomen is soft.  Musculoskeletal:        General: No swelling. Normal range of motion.     Cervical back: Normal range of motion and neck supple.  Skin:    General: Skin is warm and dry.     Coloration: Skin is not jaundiced.  Neurological:     General: No focal deficit present.     Mental Status: She is alert and oriented to person, place, and time.  Psychiatric:        Mood and Affect: Mood normal.        Behavior: Behavior normal.      Assessment: *Diarrhea *Right upper quadrant pain *History of C. difficile infection  Plan: Etiology of patient's diarrhea unclear.  Her recent C. difficile  testing is negative so I believe she has recovered from this infection.  Given her history of microscopic colitis I am concerned that this is resurface.  We will empirically treat her on budesonide for 6 to 8 weeks and see how she does.  If not improved we will proceed with colonoscopy to further evaluate.  Continue on probiotics.  Regards to her right upper quadrant pain, she is concerned about her gallbladder despite relatively unremarkable ultrasound.  We will order HIDA scan to further evaluate this.  Patient follow-up in 6 to 8 weeks or sooner if  needed.  12/11/2020 3:44 PM   Disclaimer: This note was dictated with voice recognition software. Similar sounding words can inadvertently be transcribed and may not be corrected upon review.

## 2020-12-11 NOTE — Patient Instructions (Signed)
We will check HIDA scan to further evaluate your gallbladder.  Due to your history of microscopic colitis, I am going to empirically treat you on budesonide 9 mg daily.  I want you to take this dose for the next 8 weeks.  Follow-up with me in 2 to 3 months.  If this does not help your symptoms, we may need to consider colonoscopy.  Continue on probiotic.  I have also printed information office regarding a low FODMAP diet which may continue and on which food groups to avoid.  At [facility] we value your feedback. You may receive a survey about your visit today. Please share your experience as we strive to create trusting relationships with our patients to provide genuine, compassionate, quality care.  We appreciate your understanding and patience as we review any laboratory studies, imaging, and other diagnostic tests that are ordered as we care for you. Our office policy is 5 business days for review of these results, and any emergent or urgent results are addressed in a timely manner for your best interest. If you do not hear from our office in 1 week, please contact us.   We also encourage the use of MyChart, which contains your medical information for your review as well. If you are not enrolled in this feature, an access code is on this after visit summary for your convenience. Thank you for allowing [place] to be involved in your care.  It was great to see you today!  I hope you have a great rest of your winter!!    Hennie Duos. Marletta Lor, D.O. Gastroenterology and Hepatology [facility]

## 2020-12-17 ENCOUNTER — Encounter (HOSPITAL_COMMUNITY): Payer: Self-pay

## 2020-12-17 ENCOUNTER — Other Ambulatory Visit: Payer: Self-pay

## 2020-12-17 ENCOUNTER — Ambulatory Visit (HOSPITAL_COMMUNITY)
Admission: RE | Admit: 2020-12-17 | Discharge: 2020-12-17 | Disposition: A | Discharge status: Home / Self Care | Payer: BC Managed Care – PPO | Source: Ambulatory Visit | Attending: Internal Medicine | Admitting: Internal Medicine

## 2020-12-17 DIAGNOSIS — A0472 Enterocolitis due to Clostridium difficile, not specified as recurrent: Secondary | ICD-10-CM | POA: Insufficient documentation

## 2020-12-17 MED ORDER — TECHNETIUM TC 99M MEBROFENIN IV KIT
5.0000 | PACK | Freq: Once | INTRAVENOUS | Status: AC | PRN
  Administered 2020-12-17: 5.5 via INTRAVENOUS

## 2021-01-21 ENCOUNTER — Encounter: Payer: Self-pay | Admitting: Internal Medicine

## 2021-07-08 IMAGING — CT CT ABD-PELV W/ CM
2 of 5 series · 17 of 46 positions shown, 19 images · IV contrast (Omnipaque or Isovue)
Comparison: None.

CLINICAL DATA: Diarrhea, right flank pain

EXAM:
CT ABDOMEN AND PELVIS WITH CONTRAST
TECHNIQUE: Multidetector CT imaging of the abdomen and pelvis was performed
using the standard protocol following bolus administration of
intravenous contrast.
CONTRAST:  100mL OMNIPAQUE IOHEXOL 300 MG/ML  SOLN

[Series 2: axial st · axial · 0.92mm/px · z∈[+804,+1179]mm · 14 of 87 slices shown, 16 images]
[im 6/87  soft-tissue]
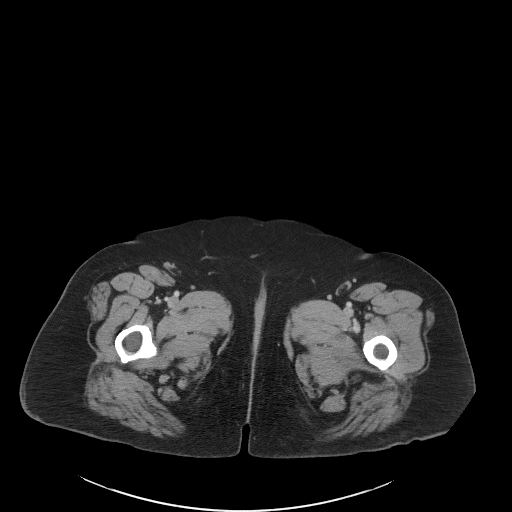
[im 6/87  bone]
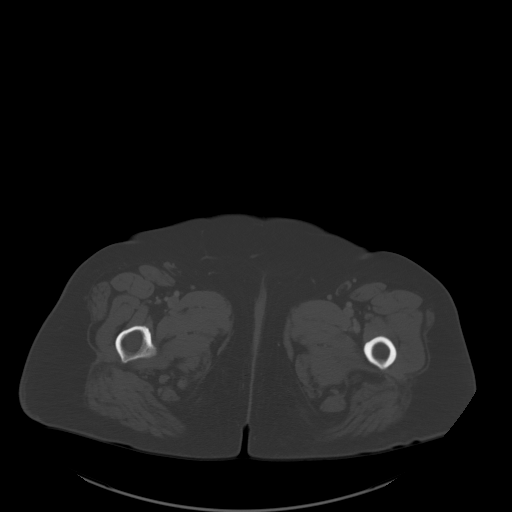
[im 11/87  soft-tissue]
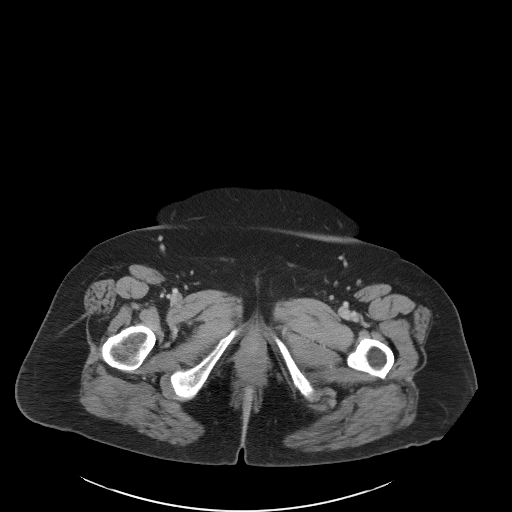
[im 16/87  soft-tissue]
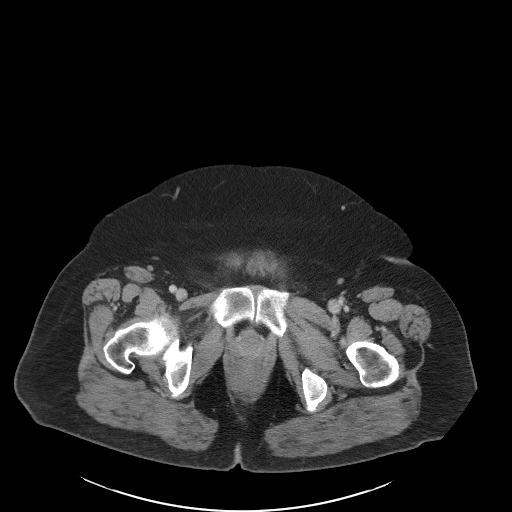
[im 26/87  soft-tissue]
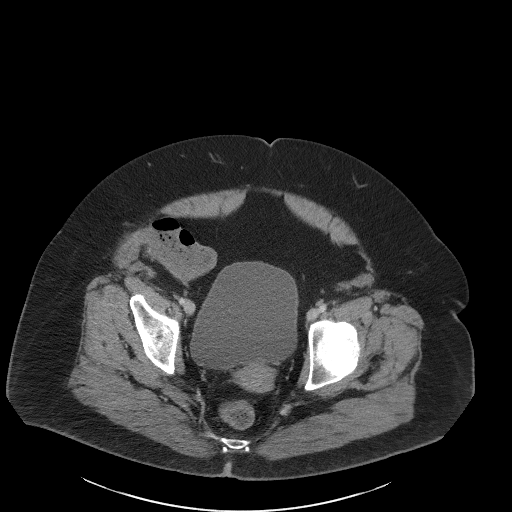
[im 31/87  soft-tissue]
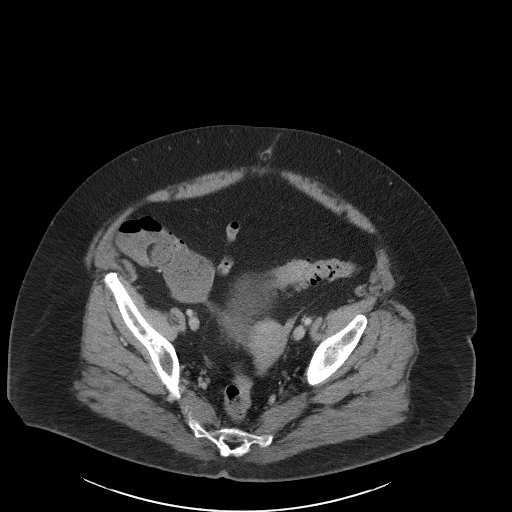
[im 36/87  soft-tissue]
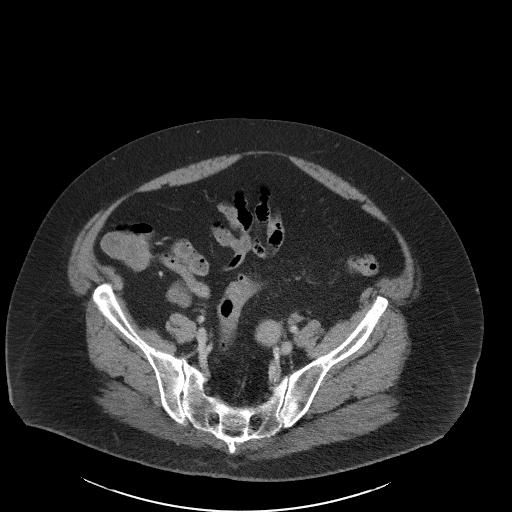
[im 41/87  soft-tissue]
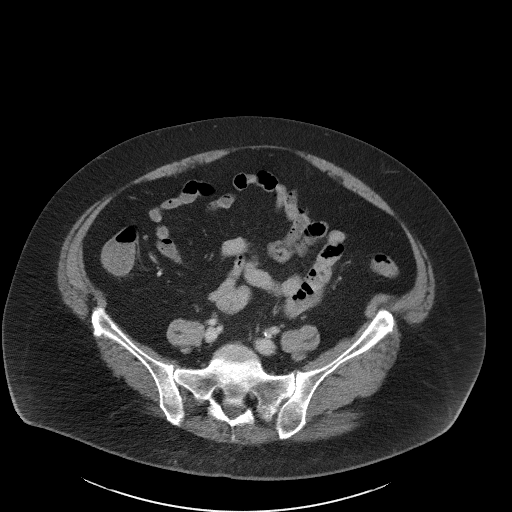
[im 46/87  soft-tissue]
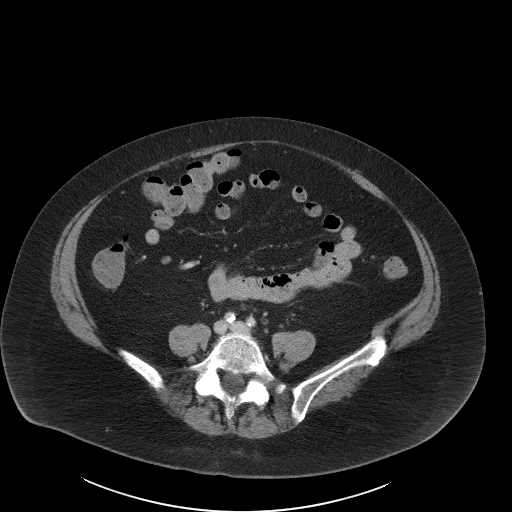
[im 51/87  soft-tissue]
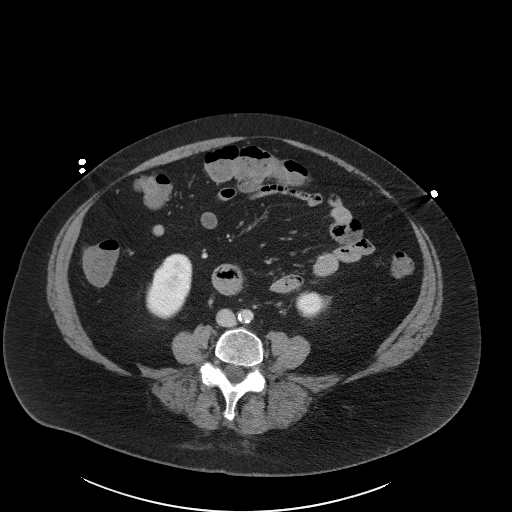
[im 51/87  bone]
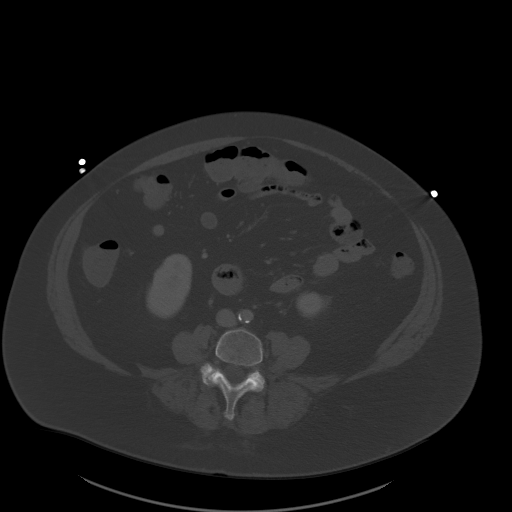
[im 56/87  soft-tissue]
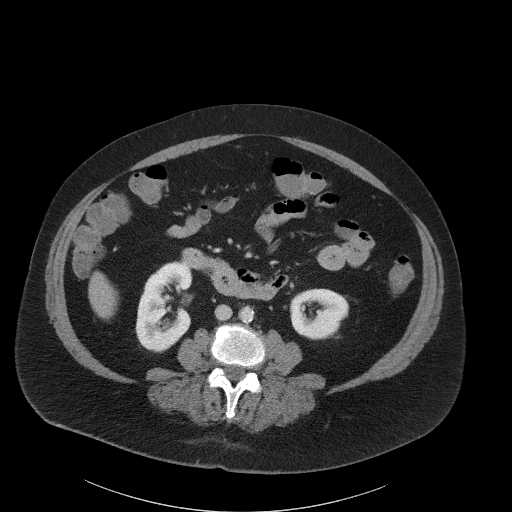
[im 66/87  soft-tissue]
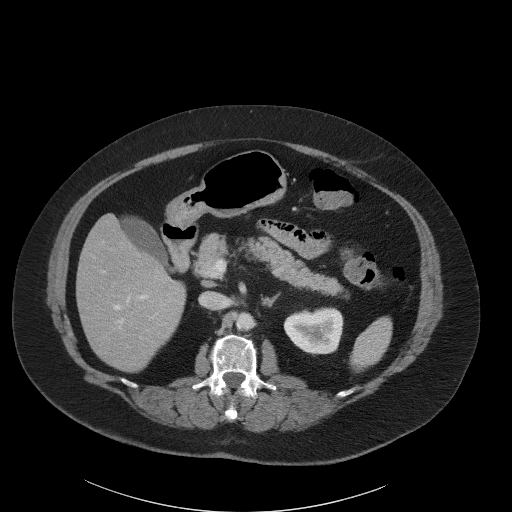
[im 71/87  soft-tissue]
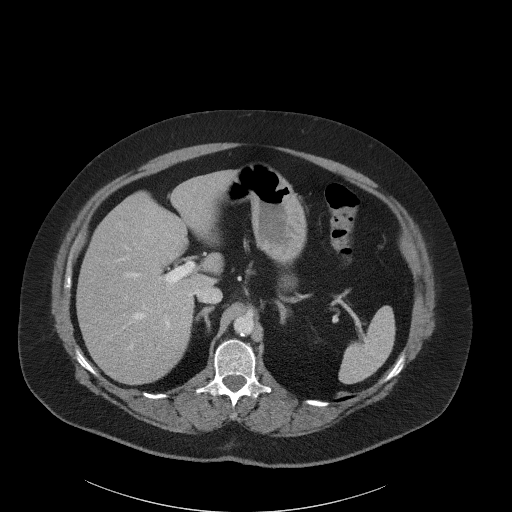
[im 76/87  soft-tissue]
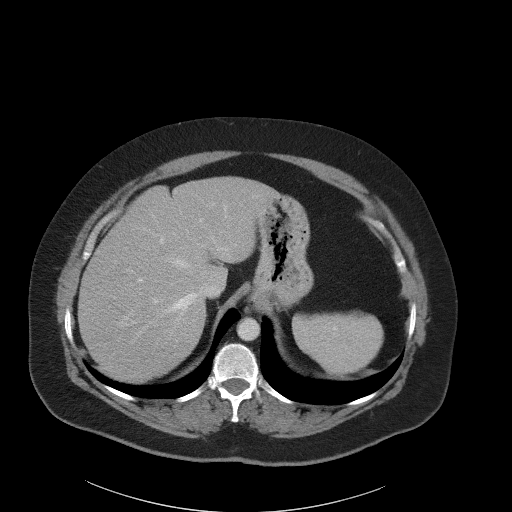
[im 81/87  soft-tissue]
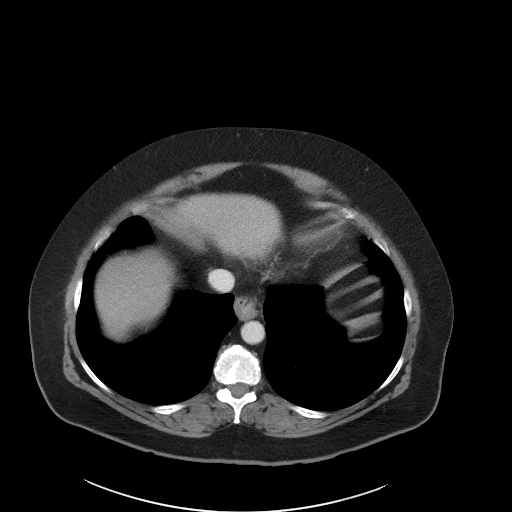

[Series 4: coronal st · coronal · 0.88mm/px · 3 of 121 slices shown]
[im 41/121  soft-tissue]
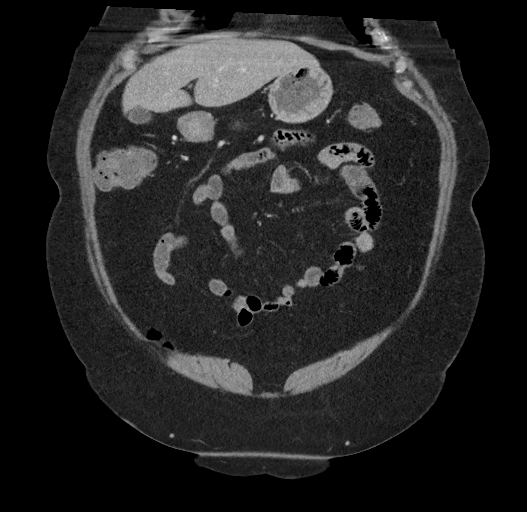
[im 54/121  soft-tissue]
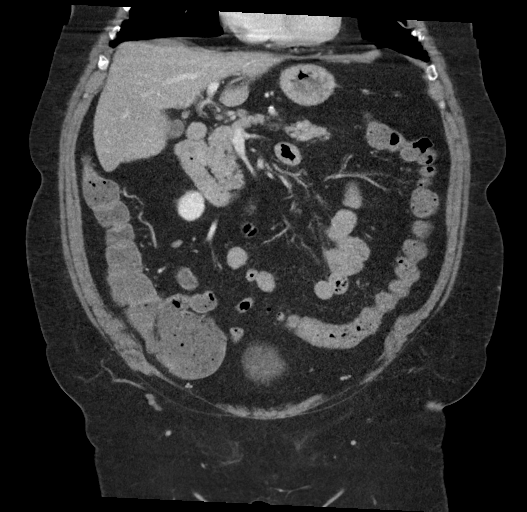
[im 67/121  soft-tissue]
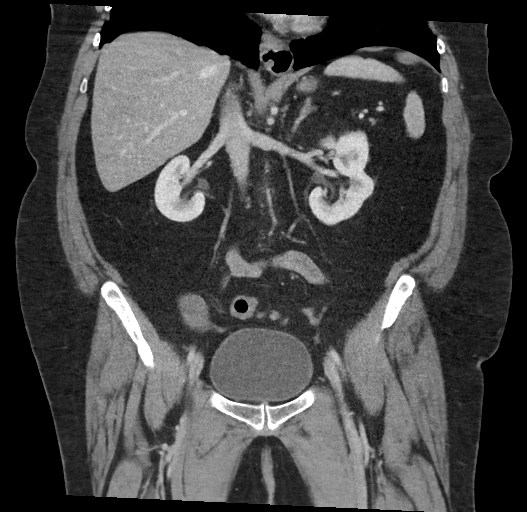

[17 of 46 positions shown; findings below may reference images not displayed]

FINDINGS: Lower chest: Lung bases are clear. No effusions. Heart is normal
size. Small hiatal hernia.

Hepatobiliary: No focal hepatic abnormality. Gallbladder
unremarkable.

Pancreas: No focal abnormality or ductal dilatation.

Spleen: No focal abnormality.  Normal size.

Adrenals/Urinary Tract: No adrenal abnormality. No focal renal
abnormality. No stones or hydronephrosis. Urinary bladder is
unremarkable.

Stomach/Bowel: Sigmoid diverticulosis. No active diverticulitis.
Stomach and small bowel decompressed, unremarkable. No bowel
obstruction.

Vascular/Lymphatic: Aortic atherosclerosis. No evidence of aneurysm
or adenopathy.

Reproductive: Uterus and adnexa unremarkable.  No mass.

Other: No free fluid or free air.

Musculoskeletal: No acute bony abnormality.
IMPRESSION: Small hiatal hernia.

Sigmoid diverticulosis.  No active diverticulitis.

Aortic atherosclerosis.

No acute findings.
